# Patient Record
Sex: Female | Born: 1935 | Race: White | Hispanic: No | Marital: Married
Health system: Southern US, Community
[De-identification: ages and names within clinical notes are randomized; demographics above are authoritative.]

---

## 2005-03-31 ENCOUNTER — Ambulatory Visit: Payer: Self-pay | Admitting: Gastroenterology

## 2005-05-18 ENCOUNTER — Ambulatory Visit: Payer: Self-pay | Admitting: Internal Medicine

## 2005-06-28 ENCOUNTER — Ambulatory Visit: Payer: Self-pay | Admitting: Internal Medicine

## 2005-07-09 ENCOUNTER — Ambulatory Visit: Payer: Self-pay | Admitting: Internal Medicine

## 2005-08-08 ENCOUNTER — Ambulatory Visit: Payer: Self-pay | Admitting: Internal Medicine

## 2005-09-08 ENCOUNTER — Ambulatory Visit: Payer: Self-pay | Admitting: Internal Medicine

## 2005-10-11 ENCOUNTER — Other Ambulatory Visit: Payer: Self-pay

## 2005-10-11 ENCOUNTER — Emergency Department: Payer: Self-pay | Admitting: Emergency Medicine

## 2006-06-29 ENCOUNTER — Ambulatory Visit: Payer: Self-pay | Admitting: Internal Medicine

## 2007-06-13 ENCOUNTER — Ambulatory Visit: Payer: Self-pay | Admitting: Internal Medicine

## 2007-07-03 ENCOUNTER — Ambulatory Visit: Payer: Self-pay | Admitting: Ophthalmology

## 2007-07-14 ENCOUNTER — Ambulatory Visit: Payer: Self-pay | Admitting: Vascular Surgery

## 2007-07-17 ENCOUNTER — Ambulatory Visit: Payer: Self-pay | Admitting: Ophthalmology

## 2007-08-02 ENCOUNTER — Ambulatory Visit: Payer: Self-pay | Admitting: Internal Medicine

## 2007-08-15 ENCOUNTER — Ambulatory Visit: Payer: Self-pay | Admitting: Cardiology

## 2007-09-20 ENCOUNTER — Ambulatory Visit: Payer: Self-pay | Admitting: Vascular Surgery

## 2007-09-27 ENCOUNTER — Inpatient Hospital Stay: Payer: Self-pay | Admitting: Vascular Surgery

## 2007-12-28 ENCOUNTER — Other Ambulatory Visit: Payer: Self-pay

## 2007-12-28 ENCOUNTER — Inpatient Hospital Stay: Payer: Self-pay | Admitting: Internal Medicine

## 2008-06-03 ENCOUNTER — Ambulatory Visit: Payer: Self-pay | Admitting: Urology

## 2008-06-26 ENCOUNTER — Other Ambulatory Visit: Payer: Self-pay

## 2008-06-26 ENCOUNTER — Ambulatory Visit: Payer: Self-pay | Admitting: Ophthalmology

## 2008-07-08 ENCOUNTER — Ambulatory Visit: Payer: Self-pay | Admitting: Ophthalmology

## 2008-08-08 ENCOUNTER — Ambulatory Visit: Payer: Self-pay | Admitting: Internal Medicine

## 2009-02-10 ENCOUNTER — Emergency Department: Payer: Self-pay | Admitting: Internal Medicine

## 2009-02-24 ENCOUNTER — Ambulatory Visit: Payer: Self-pay | Admitting: Neurology

## 2009-07-10 ENCOUNTER — Inpatient Hospital Stay: Payer: Self-pay | Admitting: Unknown Physician Specialty

## 2009-12-23 ENCOUNTER — Emergency Department: Payer: Self-pay | Admitting: Emergency Medicine

## 2010-07-12 ENCOUNTER — Emergency Department: Payer: Self-pay | Admitting: Emergency Medicine

## 2010-07-24 ENCOUNTER — Ambulatory Visit: Payer: Self-pay | Admitting: Internal Medicine

## 2010-11-12 IMAGING — CT CT HEAD WITHOUT CONTRAST
2 series · 15 of 30 positions shown, 19 images · non-contrast
Comparison: none

REASON FOR EXAM: HEAD INJURY ON COUMADIN
COMMENTS:

PROCEDURE:     CT  - CT HEAD WITHOUT CONTRAST  - July 12, 2010  [DATE]
RESULT:
TECHNIQUE: Noncontrast emergent CT of the brain is compared to the previous
examination dated 02/24/2009.

[Series 2: without · axial · non-contrast · 0.43mm/px · z∈[+230,+354]mm · 13 of 31 slices shown, 17 images]
[im 3/31  brain]
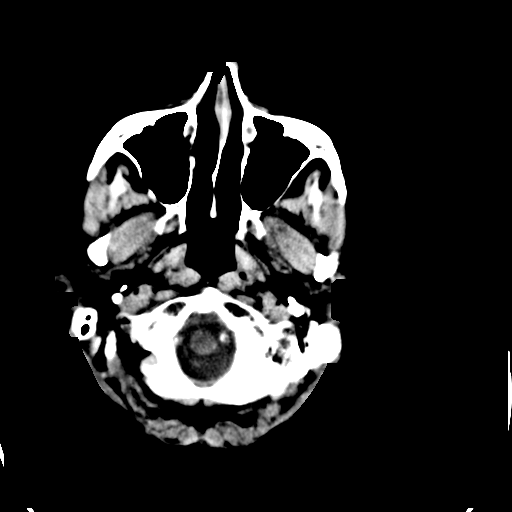
[im 3/31  bone]
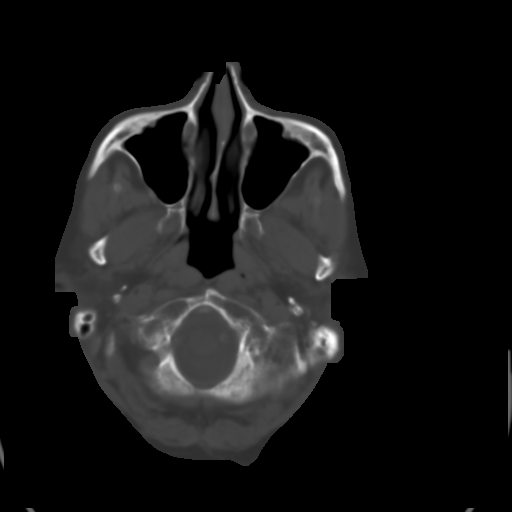
[im 5/31  brain]
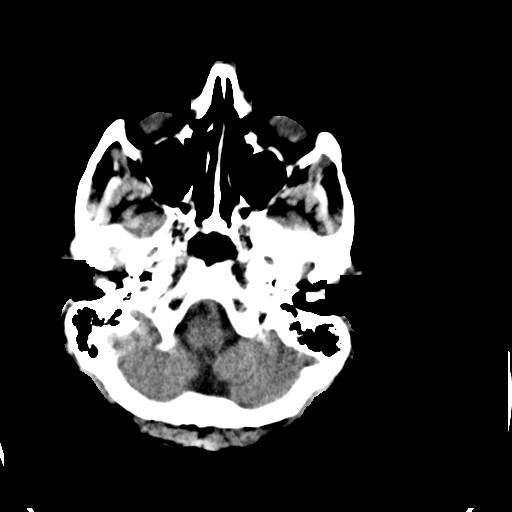
[im 7/31  brain]
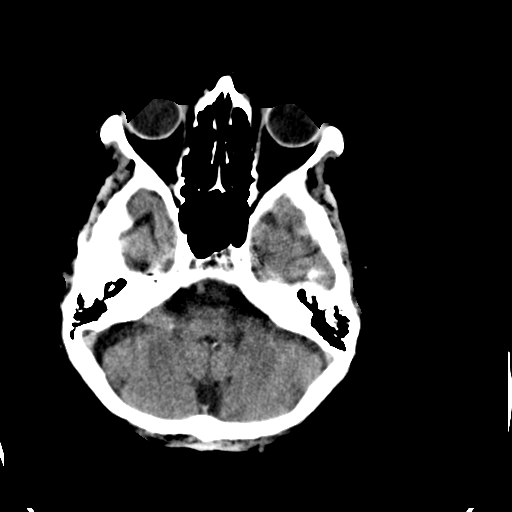
[im 9/31  brain]
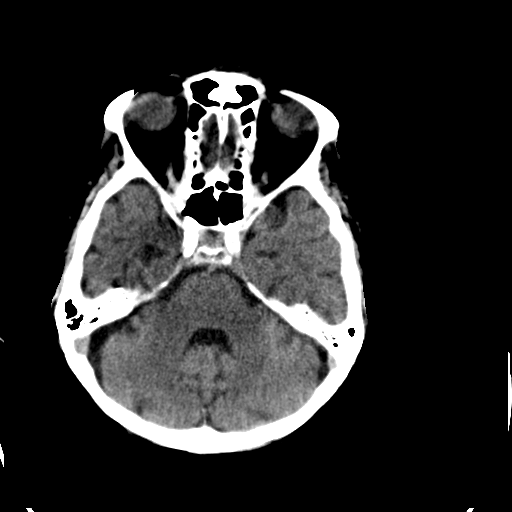
[im 11/31  brain]
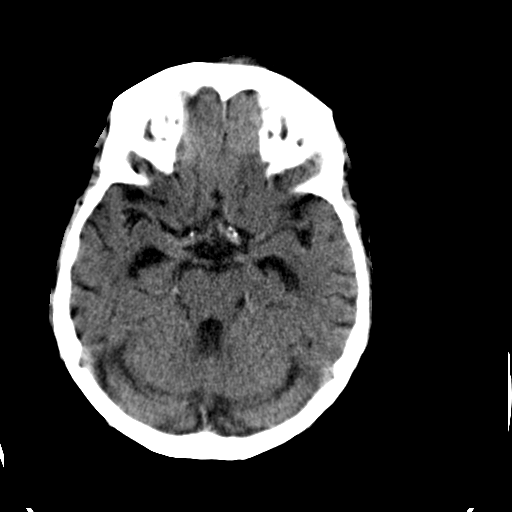
[im 11/31  bone]
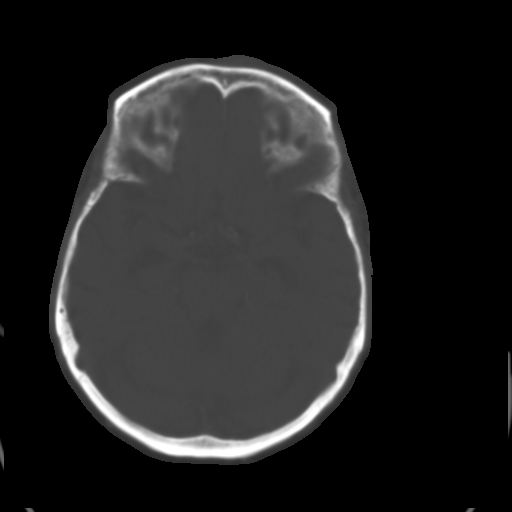
[im 13/31  brain]
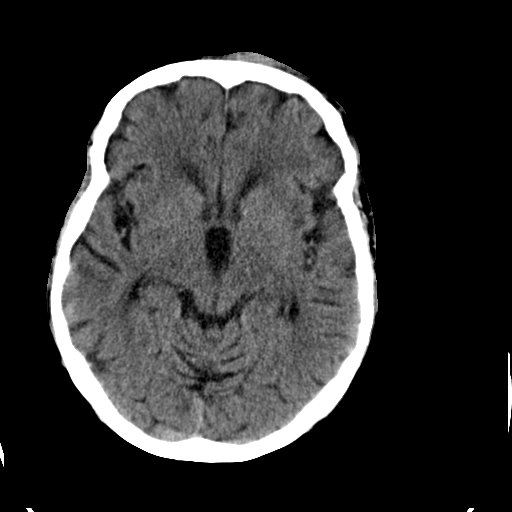
[im 16/31  brain]
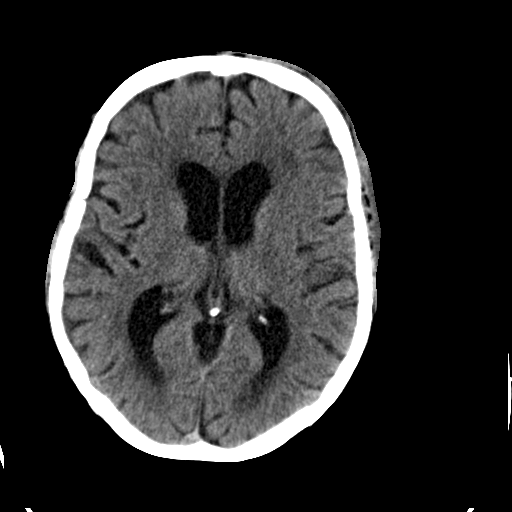
[im 18/31  brain]
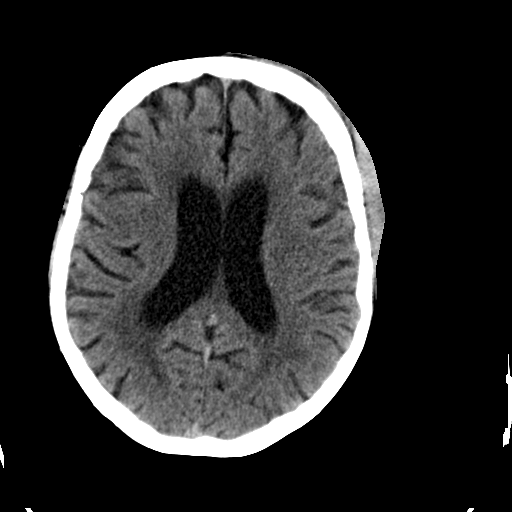
[im 20/31  brain]
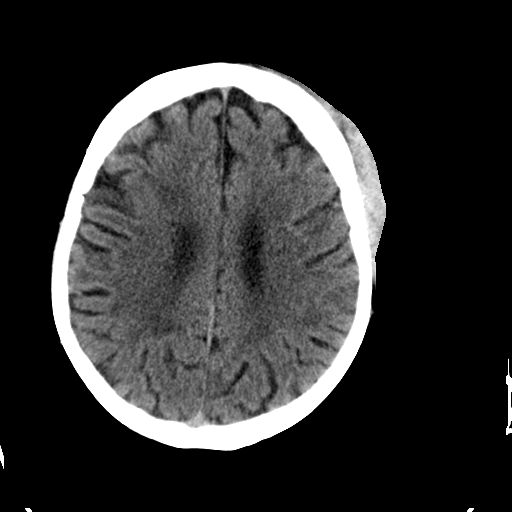
[im 20/31  bone]
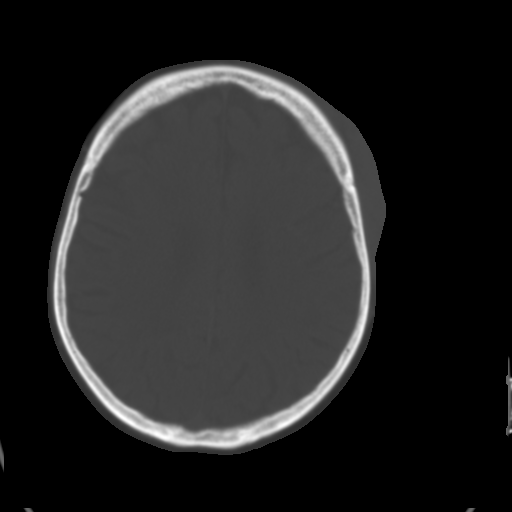
[im 22/31  brain]
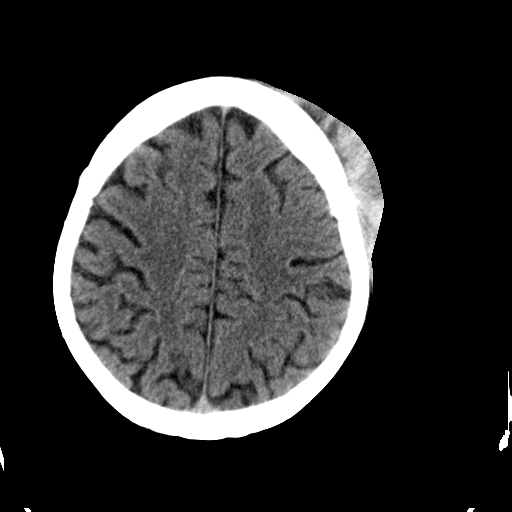
[im 24/31  brain]
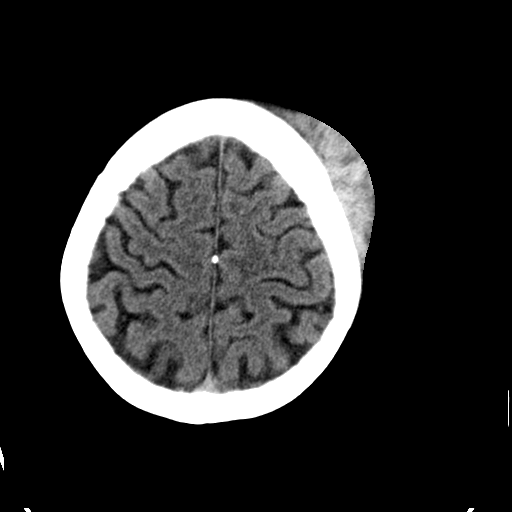
[im 26/31  brain]
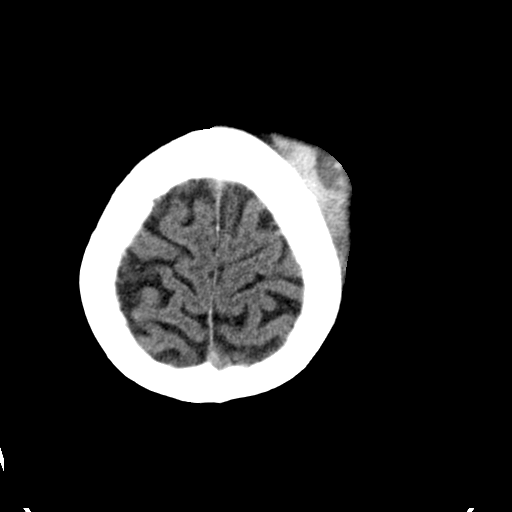
[im 28/31  brain]
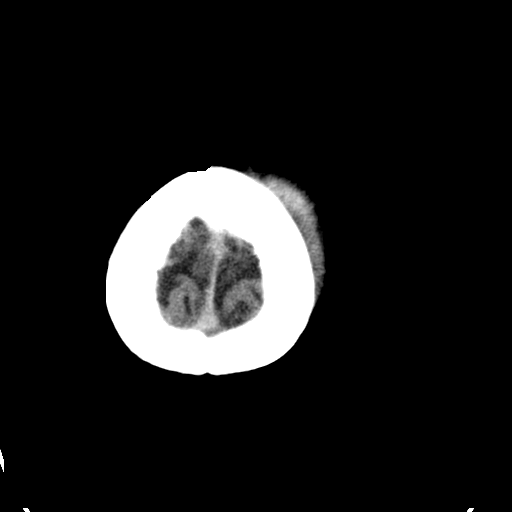
[im 28/31  bone]
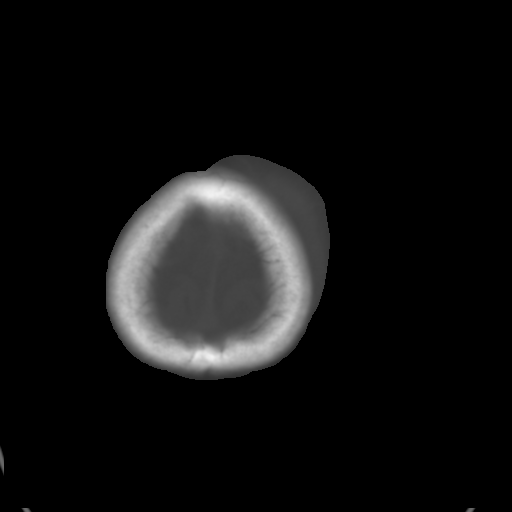

[Series 3: bone · axial · 0.43mm/px · z∈[+230,+250]mm · 2 of 31 slices shown]
[im 3/31  bone]
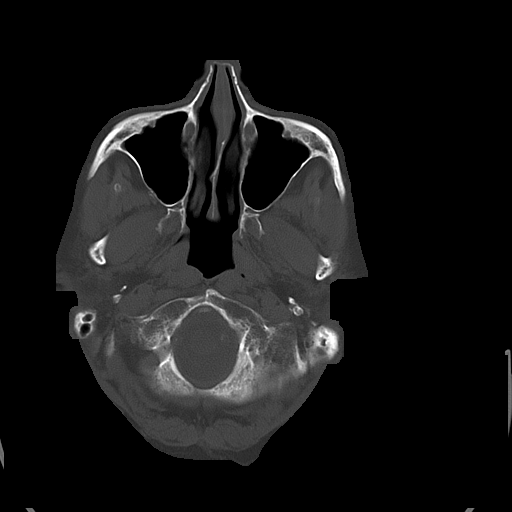
[im 7/31  bone]
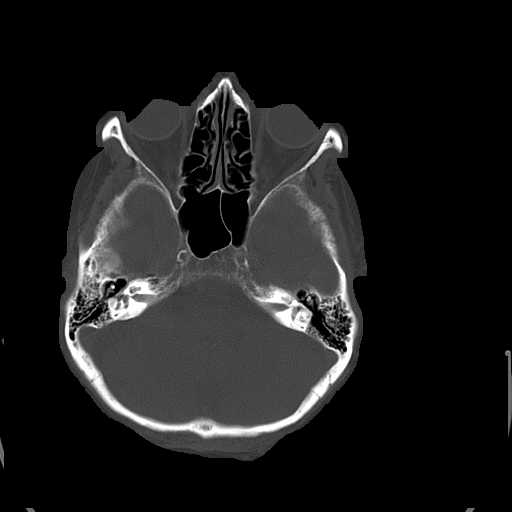

[15 of 30 positions shown; findings below may reference images not displayed]

FINDINGS: There is significant soft tissue edema and subcutaneous hematoma
over the left parietal to left frontal regions with evidence of laceration.
No definite fracture is identified. There is prominence of the ventricles
and sulci consistent with atrophy. A right basal ganglia lacunar infarct is
present on image 14. There is diffuse low attenuation in the periventricular
and subcortical white matter. There is no mass effect or midline shift. In
addition to the right thalamic lacunar infarct, mentioned above, there are
other tiny hypodensities in the basal ganglia in the internal capsule
regions which are nonspecific and could represent prominent perivascular
spaces or lacunar infarcts. There is a stable appearance of the right nasal
bone consistent with an old fracture.
IMPRESSION: Atrophy with chronic small vessel ischemic disease and
lacunar infarcts. Significant subcutaneous hematoma. No intracranial
hemorrhage evident.

## 2010-11-12 IMAGING — CT CT CERVICAL SPINE WITHOUT CONTRAST
1 series · 12 of 14 positions shown, 15 images · non-contrast
Comparison: none

REASON FOR EXAM: HEAD INJURY, NECK PAIN
COMMENTS:

PROCEDURE:     CT  - CT CERVICAL SPINE WO  - July 12, 2010  [DATE]
RESULT:
TECHNIQUE: Noncontrast multislice helical acquisition through the cervical
spine is reconstructed at bone window settings in the axial, coronal and
sagittal planes at 2 mm slice thickness reconstruction.

[Series 5: axial · axial · 0.33mm/px · z∈[+91,+237]mm · 12 of 87 slices shown, 15 images]
[im 7/87  soft-tissue]
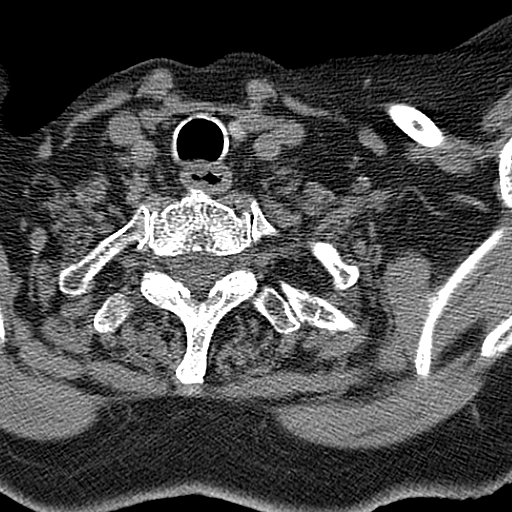
[im 7/87  bone]
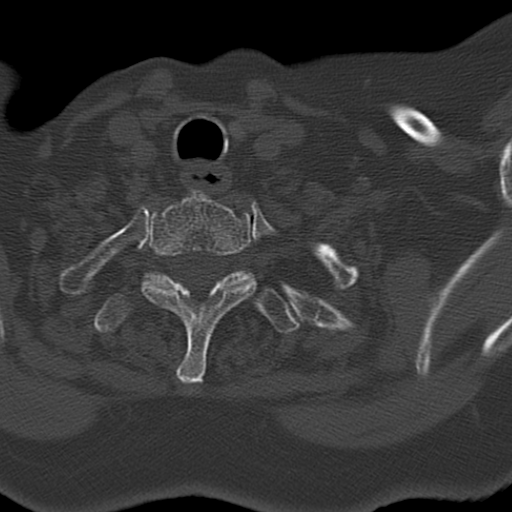
[im 14/87  bone]
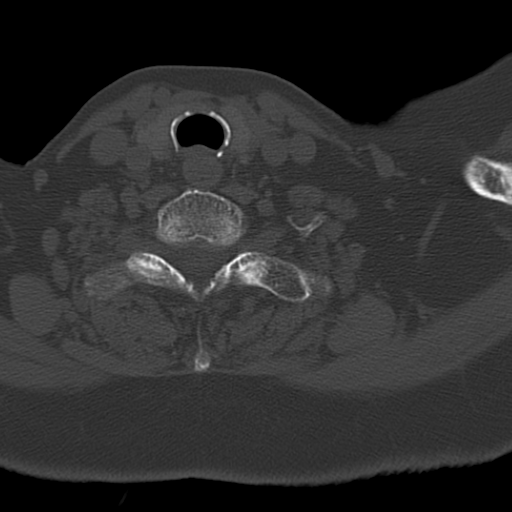
[im 20/87  bone]
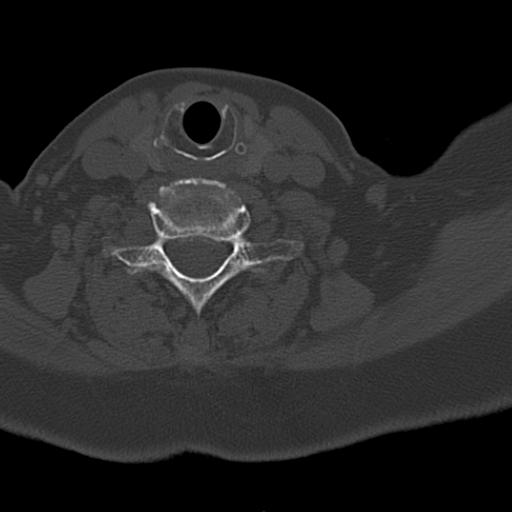
[im 27/87  bone]
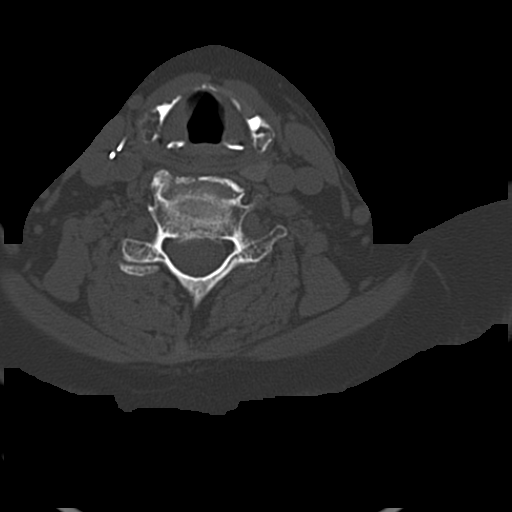
[im 34/87  soft-tissue]
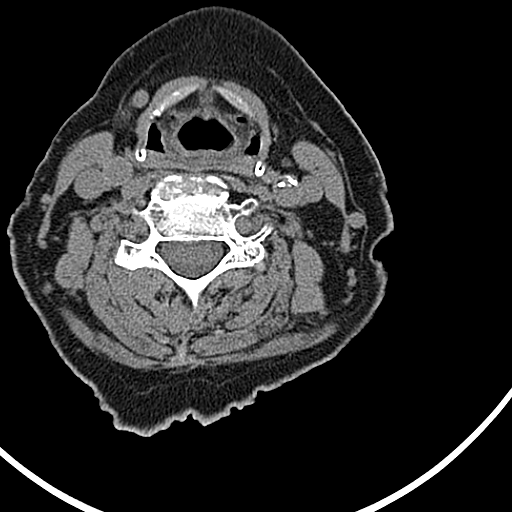
[im 34/87  bone]
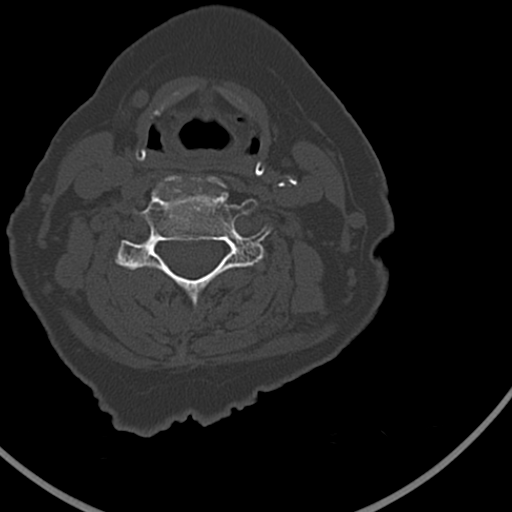
[im 40/87  bone]
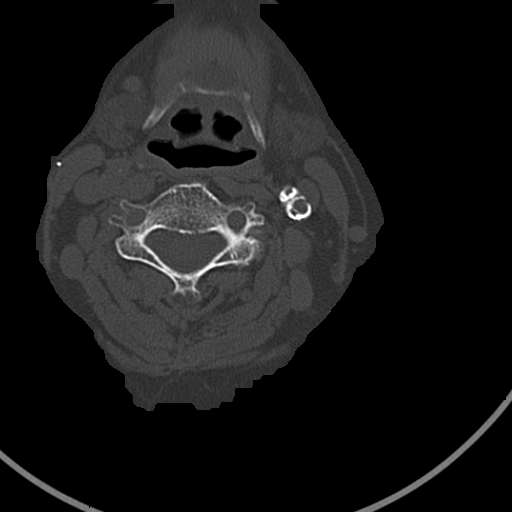
[im 47/87  bone]
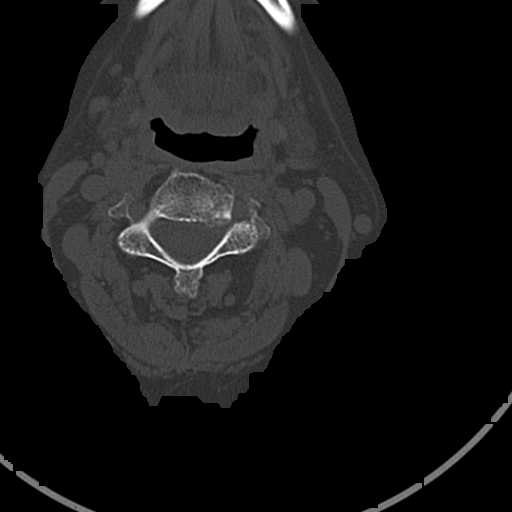
[im 53/87  bone]
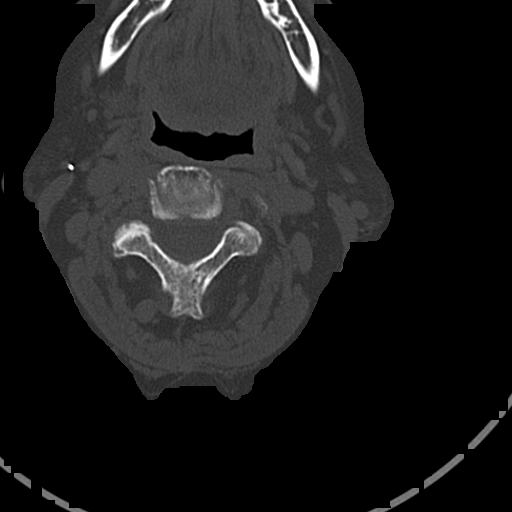
[im 60/87  soft-tissue]
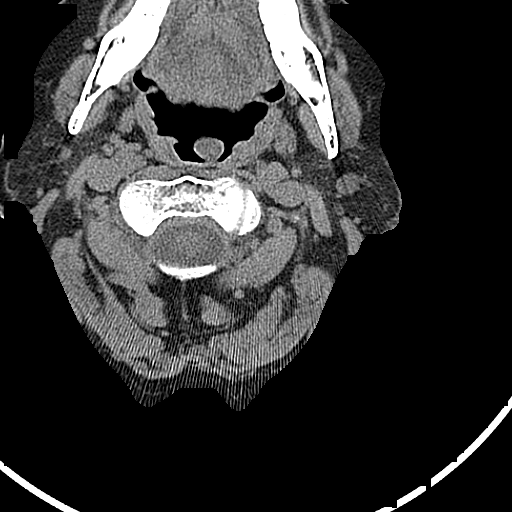
[im 60/87  bone]
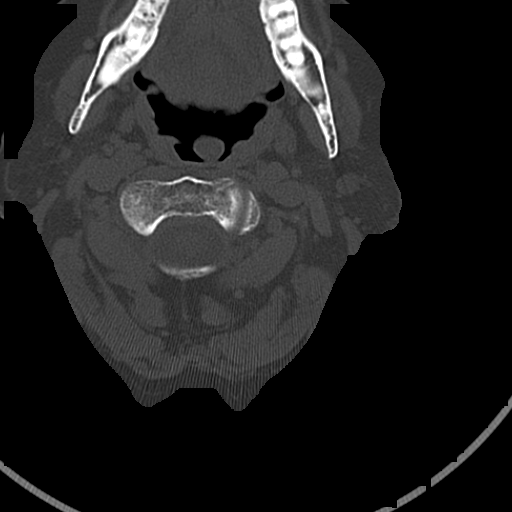
[im 67/87  bone]
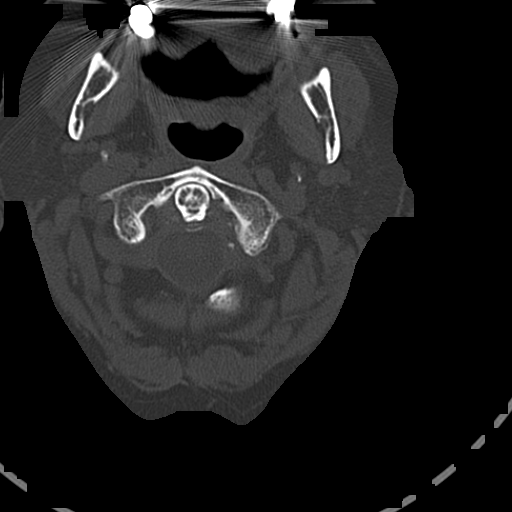
[im 73/87  bone]
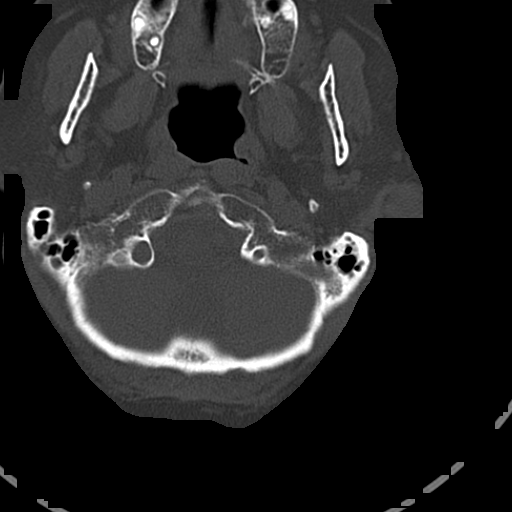
[im 80/87  bone]
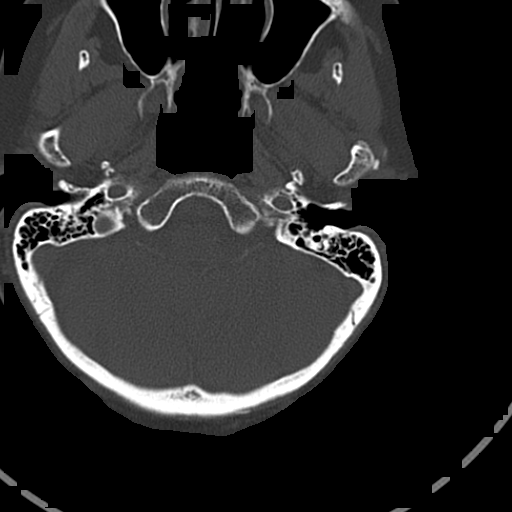

[12 of 14 positions shown; findings below may reference images not displayed]

FINDINGS: There is multilevel facet hypertrophy. There is significant
intervertebral disc space narrowing. The atlantoaxial alignment and
craniocervical junction appear to be normal. No fracture is appreciated.
Disc space narrowing and degenerative hypertrophic spurring are most
prominent at the C3-4 and C5-6 levels. Anterior and posterior bony spurring
is present at C5-6. There appears to be minimal retrolisthesis of C5
relative to C4 and C6 which is likely chronic.
IMPRESSION: Chronic degenerative changes. No acute bony abnormality
evident.

## 2012-10-07 LAB — CBC
HCT: 34 % — ABNORMAL LOW (ref 35.0–47.0)
HGB: 12 g/dL (ref 12.0–16.0)
MCHC: 35.2 g/dL (ref 32.0–36.0)
MCV: 94 fL (ref 80–100)
RDW: 13.1 % (ref 11.5–14.5)
WBC: 8.4 10*3/uL (ref 3.6–11.0)

## 2012-10-07 LAB — URINALYSIS, COMPLETE
Bilirubin,UR: NEGATIVE
Blood: NEGATIVE
Glucose,UR: NEGATIVE mg/dL (ref 0–75)
Nitrite: NEGATIVE
Ph: 7 (ref 4.5–8.0)
RBC,UR: NONE SEEN /HPF (ref 0–5)
Squamous Epithelial: 1
WBC UR: 77 /HPF (ref 0–5)

## 2012-10-07 LAB — COMPREHENSIVE METABOLIC PANEL
Albumin: 3.9 g/dL (ref 3.4–5.0)
Alkaline Phosphatase: 82 U/L (ref 50–136)
Anion Gap: 7 (ref 7–16)
BUN: 12 mg/dL (ref 7–18)
Calcium, Total: 8.6 mg/dL (ref 8.5–10.1)
Chloride: 92 mmol/L — ABNORMAL LOW (ref 98–107)
EGFR (African American): >60
EGFR (Non-African Amer.): >60
Glucose: 94 mg/dL (ref 65–99)
Potassium: 4.2 mmol/L (ref 3.5–5.1)
SGOT(AST): 34 U/L (ref 15–37)
SGPT (ALT): 28 U/L (ref 12–78)
Total Protein: 7.2 g/dL (ref 6.4–8.2)

## 2012-10-07 LAB — PRO B NATRIURETIC PEPTIDE: B-Type Natriuretic Peptide: 1318 pg/mL — ABNORMAL HIGH (ref 0–450)

## 2012-10-07 LAB — PROTIME-INR: Prothrombin Time: 13.6 secs (ref 11.5–14.7)

## 2012-10-08 ENCOUNTER — Inpatient Hospital Stay: Payer: Self-pay | Admitting: Internal Medicine

## 2012-10-08 LAB — BASIC METABOLIC PANEL
Anion Gap: 7 (ref 7–16)
BUN: 14 mg/dL (ref 7–18)
Calcium, Total: 8.5 mg/dL (ref 8.5–10.1)
Co2: 28 mmol/L (ref 21–32)
Creatinine: 0.98 mg/dL (ref 0.60–1.30)
EGFR (African American): >60
EGFR (Non-African Amer.): 52 — ABNORMAL LOW
Glucose: 100 mg/dL — ABNORMAL HIGH (ref 65–99)
Osmolality: 258 (ref 275–301)
Potassium: 3.8 mmol/L (ref 3.5–5.1)
Sodium: 128 mmol/L — ABNORMAL LOW (ref 136–145)

## 2012-10-08 LAB — CK TOTAL AND CKMB (NOT AT ARMC): CK, Total: 51 U/L (ref 21–215)

## 2012-10-09 LAB — BASIC METABOLIC PANEL
Anion Gap: 9 (ref 7–16)
BUN: 13 mg/dL (ref 7–18)
Chloride: 91 mmol/L — ABNORMAL LOW (ref 98–107)
Creatinine: 0.96 mg/dL (ref 0.60–1.30)
EGFR (African American): >60
Glucose: 209 mg/dL — ABNORMAL HIGH (ref 65–99)
Sodium: 127 mmol/L — ABNORMAL LOW (ref 136–145)

## 2012-10-10 LAB — BASIC METABOLIC PANEL
BUN: 19 mg/dL — ABNORMAL HIGH (ref 7–18)
Calcium, Total: 9 mg/dL (ref 8.5–10.1)
Chloride: 93 mmol/L — ABNORMAL LOW (ref 98–107)
EGFR (Non-African Amer.): 56 — ABNORMAL LOW
Glucose: 111 mg/dL — ABNORMAL HIGH (ref 65–99)
Potassium: 5 mmol/L (ref 3.5–5.1)
Sodium: 126 mmol/L — ABNORMAL LOW (ref 136–145)

## 2012-10-10 LAB — URINE CULTURE

## 2013-02-07 IMAGING — CR DG CHEST 2V
1 series · 3 of 3 positions shown · non-contrast
Comparison: none

REASON FOR EXAM: dyspnea
COMMENTS:

[Series 1: ap · 0.17mm/px · 3 of 3 slices shown]
[im 1/3]
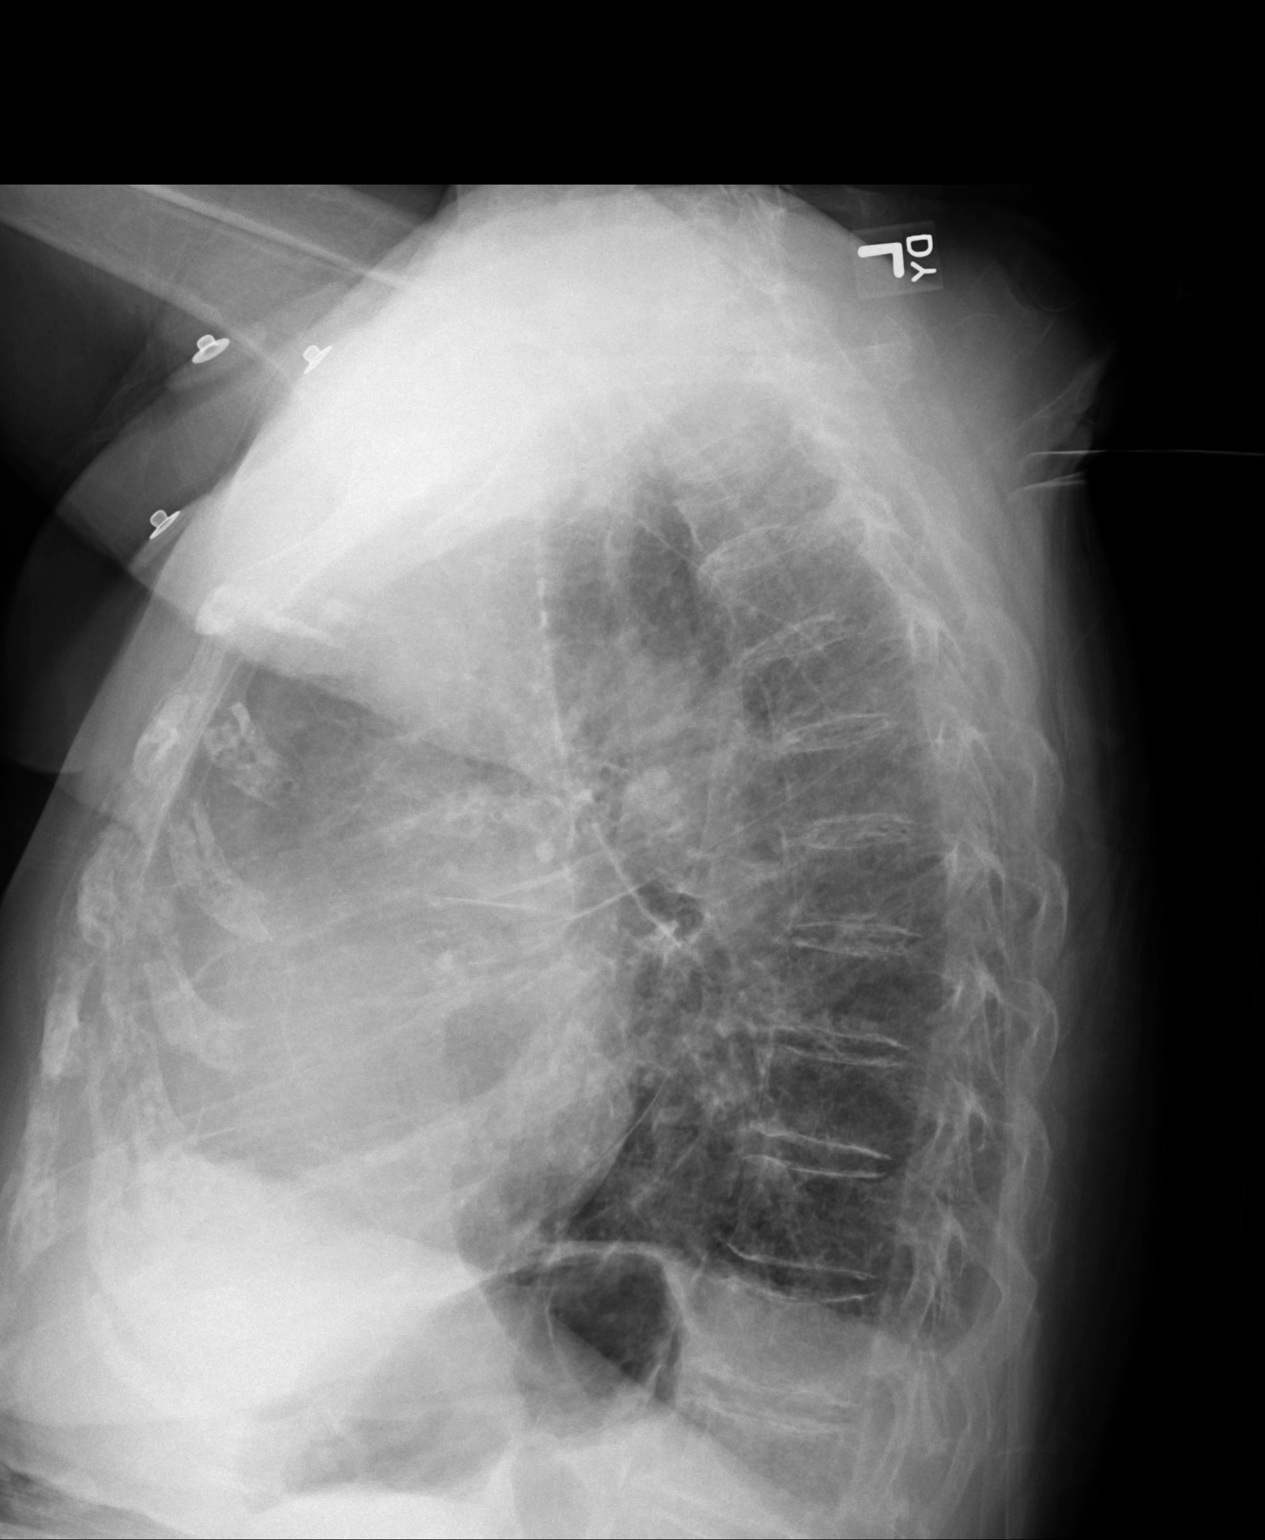
[im 2/3]
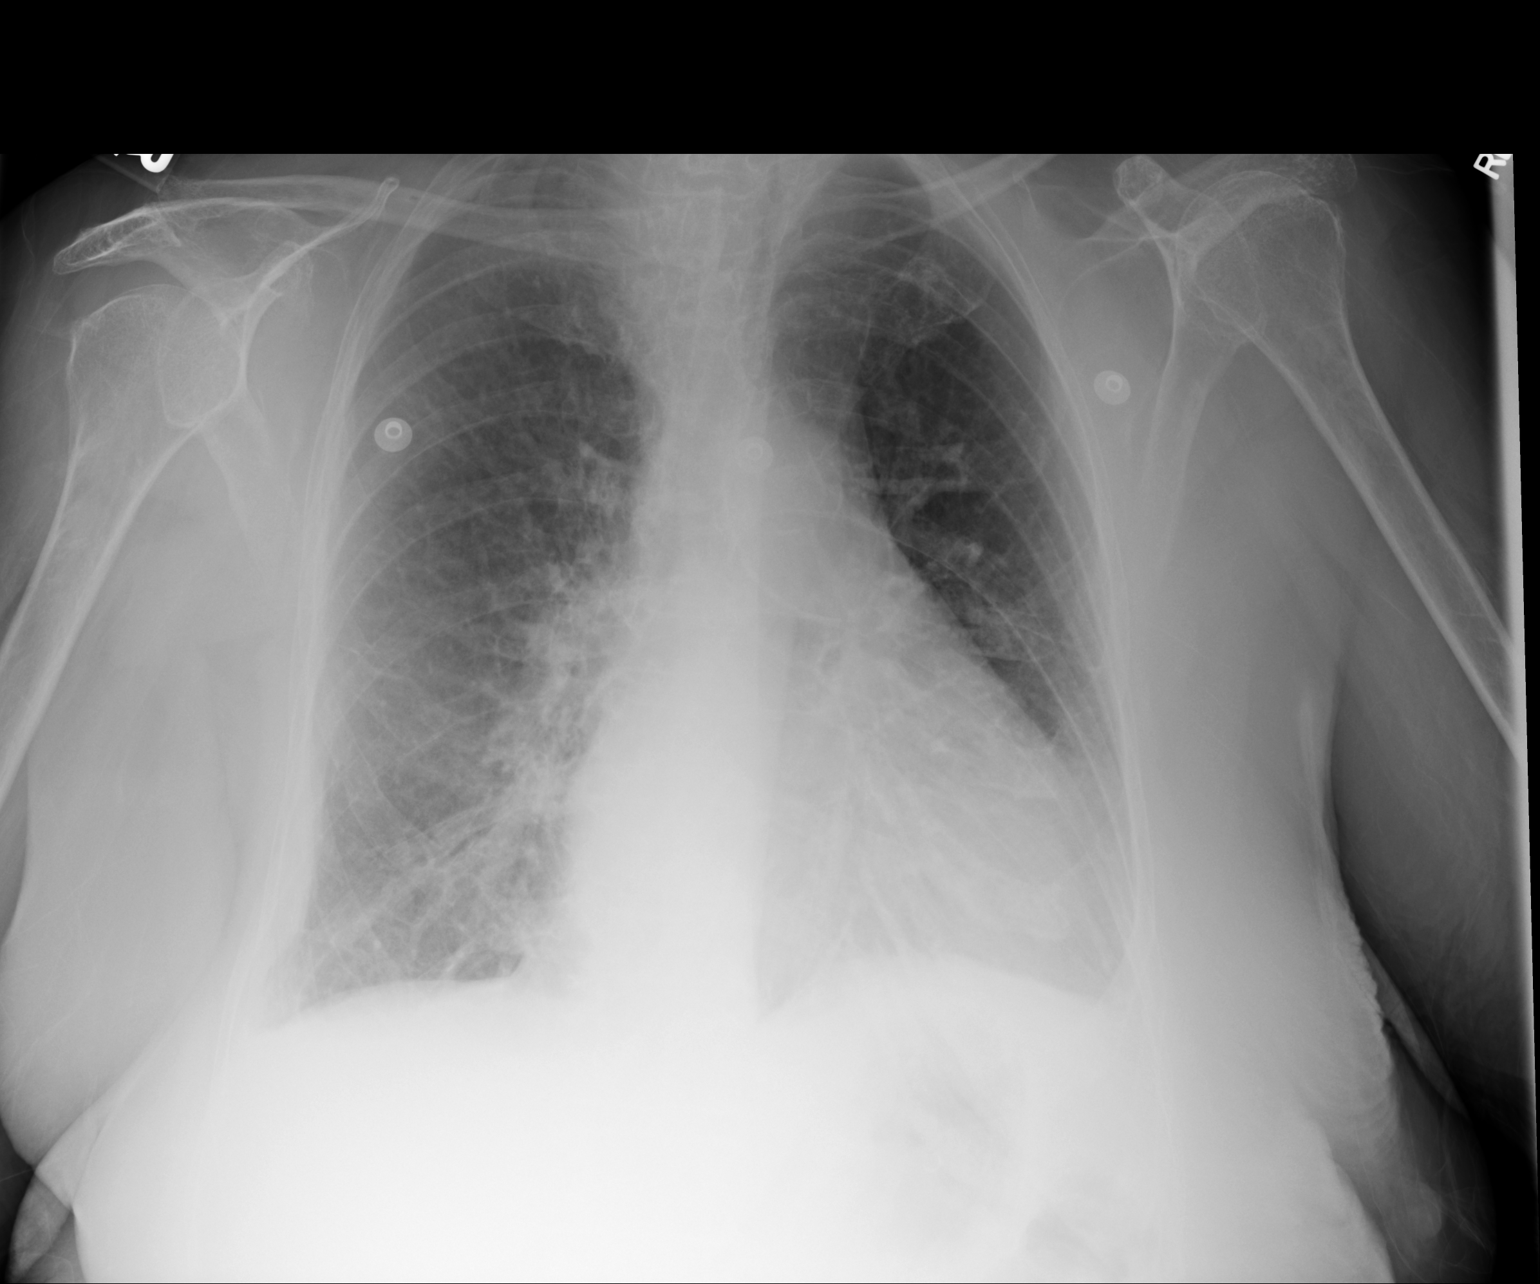
[im 3/3]
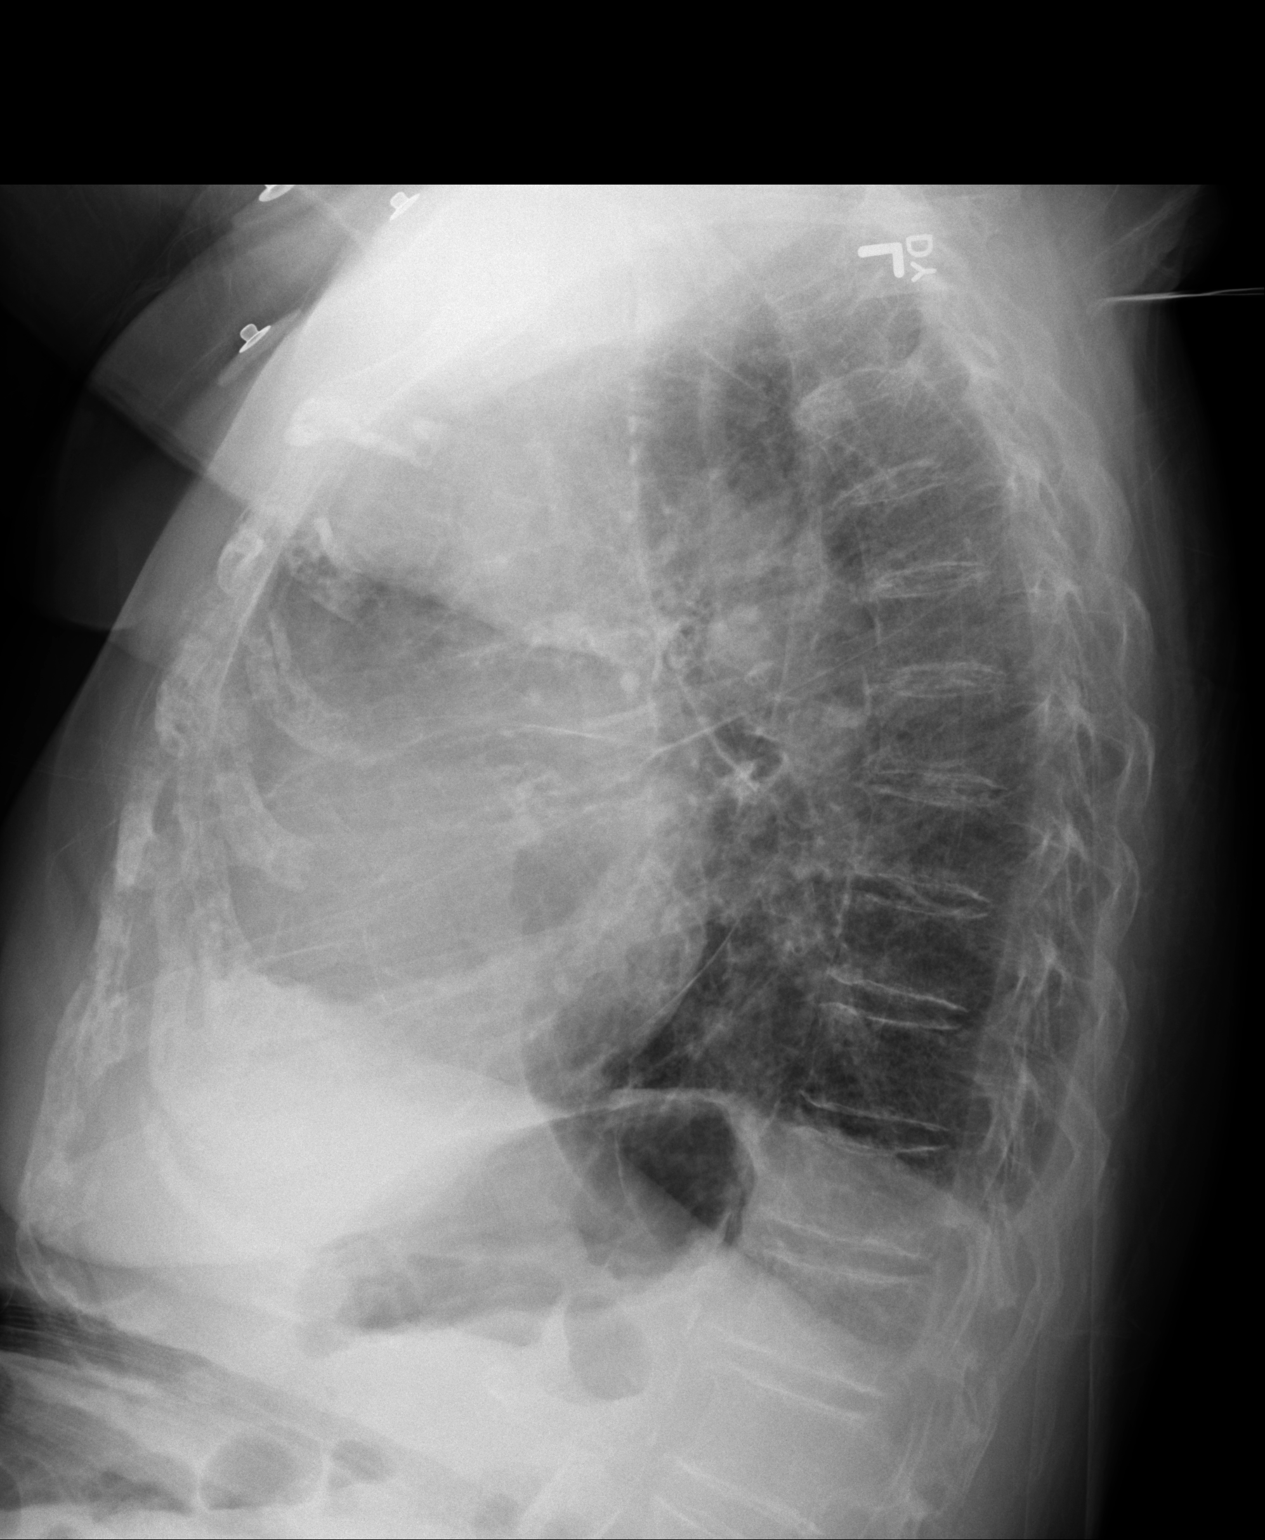

[3 of 3 positions shown; findings below may reference images not displayed]

PROCEDURE:     DXR - DXR CHEST PA (OR AP) AND LATERAL  - October 07, 2012 [DATE]

RESULT:     Comparison is made to a study 16 July, 2009.

The lungs are well-expanded. The interstitial markings are increased
bilaterally. The pulmonary vascularity is indistinct. The cardiac silhouette
is mildly enlarged. There is a small amount of blunting of the posterior
costophrenic angle on the left.
IMPRESSION: There is interstitial edema bilaterally which suggests CHF.
There may be an element of underlying COPD. Followup films following therapy
are recommended to assure clearing.

[REDACTED]

## 2015-02-25 NOTE — Discharge Summary (Signed)
PATIENT NAME:  [name], [name] DATE OF BIRTH:  06/17/1924  DATE OF ADMISSION:  10/08/2012 DATE OF DISCHARGE:  10/10/2012  DISCHARGE DIAGNOSES:  1. Acute on chronic diastolic heart failure. 2. Hypertension.  3. Hyperlipidemia.  4. History of peripheral vascular disease. 5. History of cerebrovascular accident.  6. Chronic asymptomatic hyponatremia.  7. Chronic atrial fibrillation.   MEDICATIONS:  1. Cardizem 120 mg p.o. daily.  2. KCl 10 mEq p.o. every other day.  3. Omeprazole 20 mg p.o. daily.  4. Plavix 75 mg p.o. daily.  5. Magnesium 500 mg p.o. b.i.d.  6. Vitamin B12 1000 mcg p.o. daily.  7. Fish Oil 1 gram p.o. daily. 8. Aspirin 81 mg daily.  9. Os-Cal with Vitamin D 1 tablet p.o. b.i.d.  10. Nitrostat sublingual p.r.n. for chest pain. 11. Loratadine 10 mg daily. 12. Primidone 50 mg every eight hours.  13. Atorvastatin 80 mg p.o. daily. 14. Cozaar is decreased to 50 mg once a day.  15. Lasix is decreased to 20 mg p.o. daily.  16. Coreg 3.125 mg p.o. b.i.d.   DO NOT TAKE: The patient is advised to stop the Bactrim that she was taking for the urine infection. She needs to have repeat urine cultures from primary doctor in [place]. The patient went home to [place] with son where she has a Engineer, siteprivate sitter.   CONSULTATIONS: None.   HOSPITAL COURSE: The patient is an 81 year old female who came in because of shortness of breath. The patient is cared for by the daughter and also the son. Daughter lives in [place]. Son lives in [place]. They take care of her. The patient lives with son in [place] and during Thanksgiving she came to the daughter's house. Daughter noted that she was getting more short of breath with dry cough. In the ER the patient was noted to have BNP elevated and she did have pulmonary edema. The patient's BNP on admission was 1318. Chest x-ray showed some pulmonary edema. The patient's other labs were showing sodium 126. Urine was  positive for 3+ leukocyte esterase and she is already on Bactrim for that.  For CHF elevation, the patient was continued on IV Lasix. Serial enzymes were followed and echocardiogram obtained. The patient's echo showed normal ejection fraction with LVH, moderate MR and TR. The patient did improve with IV Lasix. She did not require any oxygen. Her lungs are much clearer. The patient's swelling also decreased. However, because blood pressure was on the low side at 90's/50's, Lasix dose was decreased from 40 to 20 mg daily along with KCL. She received 40 mg of IV Lasix b.i.d. during the hospital stay.   For hypertension and chronic atrial fibrillation, the patient is on Cardizem. She had a low blood pressure as I mentioned and also the Cozaar dose is decreased. She was taking 100 daily so I decreased to 50 and Coreg was also decreased at 3.125 p.o. b.i.d. She was advised to follow-up with her primary doctor.   For history of tremor, she is taking primidone. Daughter said she has good improvement in tremors especially when she takes primidone on an empty stomach her tremors are much less. I told her to continue while she was getting it here.   CODE STATUS: DO NOT RESUSCITATE.   PERTINENT LAB DATA: BNP 1318 on admission.   EKG showed atrial fibrillation, 74 beats per minute.   Sodium 126, potassium 4.2, chloride 92, bicarb 27, BUN 12, creatinine 0.79, glucose 94. This was on  November 30th. Troponins have been negative. Sodium remained low at 126 but she is asymptomatic so she might have some chronic asymptomatic hyponatremia. Advised to follow-up with her primary doctor regarding that.     TIME SPENT ON DISCHARGE PREPARATION: More than 30 minutes.   ____________________________ Katha Hamming, MD sk:drc D: 10/11/2012 14:57:08 ET T: 10/11/2012 15:23:06 ET JOB#: [id number]  cc: Katha Hamming, MD, <Dictator> Katha Hamming MD ELECTRONICALLY SIGNED 10/24/2012 15:04

## 2015-02-25 NOTE — H&P (Signed)
PATIENT NAME:  [name], [name] MR#:  [id number] DATE OF BIRTH:  11-Apr-1924  DATE OF ADMISSION:  10/07/2012  CHIEF COMPLAINT: Shortness of breath.   HISTORY OF PRESENT ILLNESS: [name] is a delightful 81 year old white female with a history of atrial fibrillation and coronary artery disease, status post acute myocardial infarction in March 2013, who presented with a two-day history of progressive dyspnea with exertion. The patient's daughter, who is her healthcare power of attorney, noticed increased work of breathing on Thanksgiving evening. On the morning of admission, the patient was short of breath at rest and had developed a dry cough. She denied chest pain but had been reporting feeling full under both breasts. In the ER, she was noted to be in mild congestive heart failure with pulmonary edema and elevated BNP noted on work-up as well as having evidence of a urinary tract infection. The patient's daughter states that the patient has been on Bactrim since November 22nd for recurrent urinary tract infection and is still symptomatic. In the ER, she was given 60 mg of IV Lasix and was already diuresing by the time of my evaluation.   The patient splits her residence between [place], where her own home is, and on the [address] of [place]. She is cared for by a son and daughter who split their time and stay with her every evening. When she is on the [address] of [place], she does have a daytime caregiver. She does have a history of recurrent falls and now uses a wheelchair for all mobility. She is walked once a week with assistance by family.   The patient's primary care doctor was previously [place], last seen in June 2013. She is followed by a Publishing rights manager in [place], [place]. She has not seen Dr. Lorin Picket since June. Her local cardiologist is Alex Paraschos. Her cardiologist in [place] is Dr. Marylee Floras.    PAST MEDICAL HISTORY:  1. Ischemic cardiomyopathy  diagnosed in March 2013 with admission for congestive heart failure, and the patient ruled in for acute myocardial infarction. She was transferred from a small local hospital to [facility] when she ruled in for acute myocardial infarction. No cardiac catheterization was done at that time due to her age.  2. History of atrial fibrillation, previously on Coumadin until she fell and broke her ankle in 2010.  3. Peripheral vascular disease, status post CVA in 2001 with residual left-sided weakness.  4. Benign essential tremor.  5. Hypertension.  6. Hyperlipidemia.   CURRENT MEDICATIONS:  1. Diltiazem 24-hour ER extended release 120 mg, one tablet daily in the morning.  2. Furosemide 40 mg, one tablet daily in the morning. 3. Losartan 100 mg, one tablet daily in the morning. 4. Klor-Con 10 mEq, one tablet every other day.  5. Omeprazole 20 mg, one tablet daily in the morning. 6. Carvedilol 12.5 mg, one tablet twice daily with meals.  7. Primidone 50 mg, one tablet every eight hours.  8. Atorvastatin 80 mg, one tablet daily at supper.  9. Clopidogrel 75 mg, one tablet daily at supper.  10. Magnesium 500 mg, one tablet twice daily with breakfast and supper.  11. Vitamin B12 1000 mcg daily.  12. Fish oil 1000 mg daily in the morning.  13. Aspirin 81 mg daily in the morning.  14. Calcium 500 mg with vitamin D 400 units at breakfast and supper.  15. Nitrostat sublingual tablets every five minutes as needed for chest pain x3.  16. Acetaminophen 500  mg daily as needed. 17. Acetaminophen PM at bedtime as needed.  18. Loratadine 10 mg daily as needed.  19. Hydrocortisone cream 25 mg suppository, one suppository by rectum as needed for hemorrhoids. Bactrim DS one tablet twice daily, started on November 22nd.  20. Metamucil one teaspoon daily for diarrhea.   PAST SURGICAL HISTORY:  1. Appendectomy remotely.  2. Right CEA in 2003.  3. Left ORIF of left hip in 2010.  4. Rectocele and cystocele repair in  2002.  5. Ankle fracture, status post pinning in 2009. 6. Last colonoscopy in 2006 at which time polypectomy was done.   ALLERGIES: She has allergies to Altace, which causes cough.   LAST HOSPITALIZATION: Last hospitalization was in 2013 at [facility] in [place] for acute myocardial infarction and congestive heart failure.   FAMILY HISTORY: Father had hypertension and coronary artery disease. Mother had diabetes.   SOCIAL HISTORY: The patient is widowed. She lives alone but has a son and daughter staying with her at night. She does have a caregiver during the day. She is nonambulatory without assistance due to prior hip fracture and left-sided weakness. She is a lifelong nonsmoker and nondrinker.   REVIEW OF SYSTEMS: CONSTITUTIONAL: Positive for weakness, left-sided, which is chronic. No fevers or current pain issues. EYES: She denies any history of blurred or double vision or glaucoma. EAR, NOSE, AND THROAT: She denies tinnitus, ear pain, and hearing loss. She does have seasonal rhinitis. She denies epistaxis, snoring or postnasal drip. She denies difficulty swallowing. She does report dry cough of one days' duration. No wheezing, hemoptysis or painful respirations. She has had progressive dyspnea with exertion x2 days. CARDIOVASCULAR: Review of systems is negative for orthopnea and edema and chest pain. She denies palpitations and history of syncope. She does have high blood pressure. GI: Review of systems positive for loose stools secondary to medications. This is chronic. GYN: She has current dysuria secondary to recurrent UTIs. She has no history of polyuria, nocturia, or thyroid problems. No history of anemia but she does have easy bruising secondary to Plavix. No recent bleeding. She has no recent development of rashes, lesions or changes in skin. She denies neck, back, shoulder, knee or hip pain. She does have limited her activity secondary to left hip fracture and left-sided stroke. She has a  history of left-sided weakness secondary to stroke. No vertigo or ataxia. Per daughter, she has had some long-term memory loss which has been more noticeable in the last several months. PSYCHIATRIC: Review of systems is negative for anxiety, insomnia, and nervousness. She has no history of depression.   PHYSICAL EXAMINATION:  GENERAL: This is a well nourished elderly female in no apparent distress.   VITAL SIGNS:  ER vital signs initially: Temperature 98.1, pulse 79, respirations 18, blood pressure 158/64, pulse oximetry 99% on room air. Repeat blood pressure several hours later 127/60, sats 97% on room air.   HEENT: Pupils are equal, round, and reactive to light. Extraocular movements are intact. Sclerae are anicteric. Oropharynx is benign. Good dentition noted.   NECK: Supple without lymphadenopathy, JVD, thyromegaly, or carotid bruits.   LUNGS: Clear bilaterally in all lung fields. No wheezing or rhonchi.   CARDIOVASCULAR: Irregularly irregular with no murmurs, rubs, or gallops. Chest wall is nontender. Pedal pulses are palpable, and there is no lower extremity edema.   BREASTS: Nontender.   ABDOMEN: Soft, nontender, and nondistended with good bowel sounds and no evidence of hepatosplenomegaly.   GYN: Deferred.   MUSCULOSKELETAL: She  has decreased strength on the left side on upper and lower extremity. She is moving all extremities, however, without limited range of motion.   SKIN: Skin is warm and dry without rashes or lesions.   LYMPH: There is no cervical, axillary, inguinal, or supraclavicular lymphadenopathy.   NEUROLOGICAL: Cranial nerves are grossly intact. Deep tendon reflexes are diminished on the left but intact on the right. There is no dysarthria or aphasia.   PSYCHIATRIC: She is alert and oriented to person, place, and time. The patient is calm and cooperative.   LABORATORY, DIAGNOSTIC AND RADIOLOGICAL DATA: Admission laboratory data: Sodium 126, potassium 4.2, chloride  92, bicarbonate 27, BUN 12, creatinine 0.79, and glucose 94. White count 8.6, hemoglobin 12.0, platelets 212. Liver function tests are normal. Troponin I is less than 0.02. Urinalysis is positive for 3+ leukocyte esterase and 77 white blood cells. B-type natriuretic peptide is elevated at 1318. EKG shows atrial fibrillation with nonspecific intraventricular block and a rate of 74. PA and lateral chest x-ray shows bilateral pulmonary edema.   ASSESSMENT AND PLAN:  1. Congestive heart failure exacerbation: Etiology may be uncontrolled atrial fibrillation prior to admission versus acute myocardial infarction. However, the patient has no current history of pain. We will admit to a telemetry bed, treat the pulmonary edema with recurrent IV Lasix doses. Cycle cardiac enzymes and obtain echocardiogram for evaluation of ejection fraction.  2. Atrial fibrillation: Currently rate controlled with diltiazem and carvedilol. She is not anticoagulated due to recurrent falls with prior fractures. Continue Plavix.  3. Coronary artery disease: The patient has history of acute myocardial infarction in March 2013 treated at outside hospital. No records available. Per daughter, there was no aggressive intervention and no cardiac catheterization done. Continue statin, ARB, aspirin and Plavix.  4. Cognitive deficits: Per the patient's daughter, there has been some cognitive decline over the last several years, more noticeably since her heart attack. Apparently the deficits are notable for long-term deficits. However, we will continue to watch the patient for signs of sundowning.  5. Urinary tract infection: The patient still has dysuria and pyuria by current urinalysis despite seven days of Bactrim. Urine culture is ordered.   CODE STATUS/GOALS OF CARE: The patient is historically requesting a DO NOT RESUSCITATE order. I did review this with the patient and daughter, who has Power of [employer], and THEY ARE BOTH IN AGREEMENT THAT  SHE DOES NOT WANT RESUSCITATION. Order written.   ESTIMATED TIME OF CARE: 50 minutes.   ____________________________ Duncan Dull, MD tt:cbb D: 10/07/2012 14:27:00 ET T: 10/07/2012 14:56:12 ET JOB#: [id number]  cc: Duncan Dull, MD, <Dictator> Duncan Dull MD ELECTRONICALLY SIGNED 11/04/2012 12:33

## 2015-08-09 DEATH — deceased
